# Patient Record
Sex: Male | Born: 1970 | Race: White | Hispanic: No | Marital: Married | Smoking: Never smoker
Health system: Southern US, Community
[De-identification: ages and names within clinical notes are randomized; demographics above are authoritative.]

## PROBLEM LIST (undated history)

## (undated) DIAGNOSIS — K219 Gastro-esophageal reflux disease without esophagitis: Secondary | ICD-10-CM

## (undated) DIAGNOSIS — T148XXA Other injury of unspecified body region, initial encounter: Secondary | ICD-10-CM

## (undated) DIAGNOSIS — F419 Anxiety disorder, unspecified: Secondary | ICD-10-CM

## (undated) HISTORY — PX: KNEE SURGERY: SHX244

## (undated) HISTORY — PX: JOINT REPLACEMENT: SHX530

## (undated) HISTORY — PX: TONSILLECTOMY: SUR1361

## (undated) HISTORY — PX: WISDOM TOOTH EXTRACTION: SHX21

## (undated) HISTORY — PX: ROTATOR CUFF REPAIR: SHX139

## (undated) HISTORY — PX: CHOLECYSTECTOMY: SHX55

## (undated) HISTORY — PX: CERVICAL FUSION: SHX112

---

## 2005-01-06 ENCOUNTER — Ambulatory Visit (HOSPITAL_COMMUNITY): Admission: RE | Admit: 2005-01-06 | Discharge: 2005-01-06 | Payer: Self-pay | Admitting: Neurosurgery

## 2005-02-13 ENCOUNTER — Encounter: Admission: RE | Admit: 2005-02-13 | Discharge: 2005-02-13 | Payer: Self-pay | Admitting: Neurosurgery

## 2005-08-19 IMAGING — RF DG CERVICAL SPINE 1V
1 series · 3 of 3 positions shown · non-contrast
Comparison: none

CLINICAL DATA: Herniated nucleus pulposus.  ACDF/plating at C6-7.
 CERVICAL SPINE SINGLE VIEW:
 Two films are made in the lateral projection in the operating room and show good anterior position of the metal plate and screws at the C6-7 ACDF and plating.

[Series 0: run · 3 of 3 slices shown]
[im 1/3]
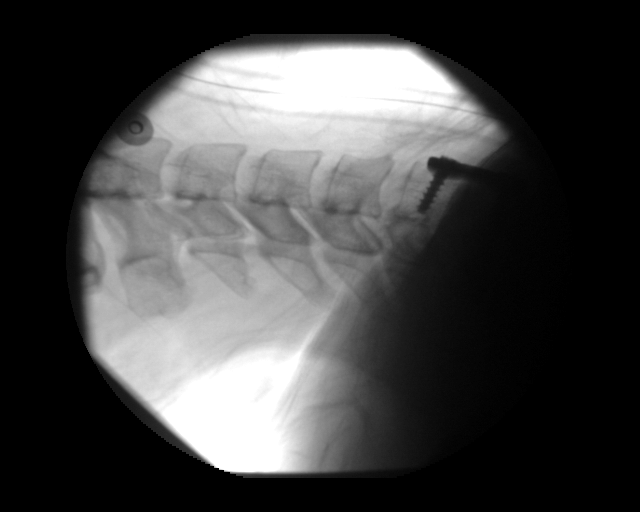
[im 2/3]
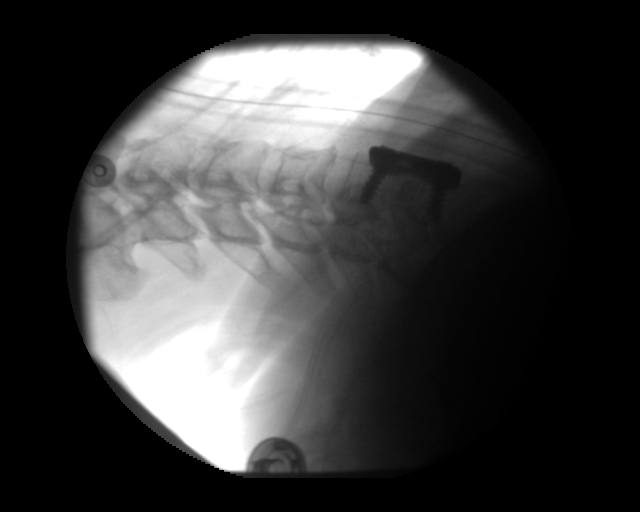
[im 3/3]
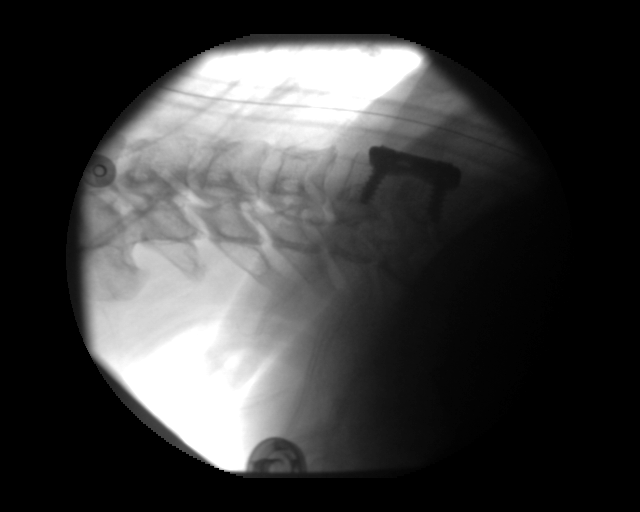

[3 of 3 positions shown; findings below may reference images not displayed]

IMPRESSION: Good position of plating/ACDF.

## 2005-09-26 IMAGING — CR DG CERVICAL SPINE 2 OR 3 VIEWS
2 series · 2 of 2 positions shown · non-contrast
Comparison: none

CLINICAL DATA: Surgery in [REDACTED] for anterior cervical spine fusion.  Some pain.
 CERVICAL SPINE ? TWO VIEWS:
 Two views of the cervical spine are compared to an intraoperative film of 01/06/05 from [HOSPITAL].  Anterior fusion at C6-7 appears stable.  The interbody fusion plug is in good position as is the anterior metallic fixation plate with slightly straightened alignment.  No significant prevertebral soft tissue swelling is seen.

[view not recorded (1 of 2)]
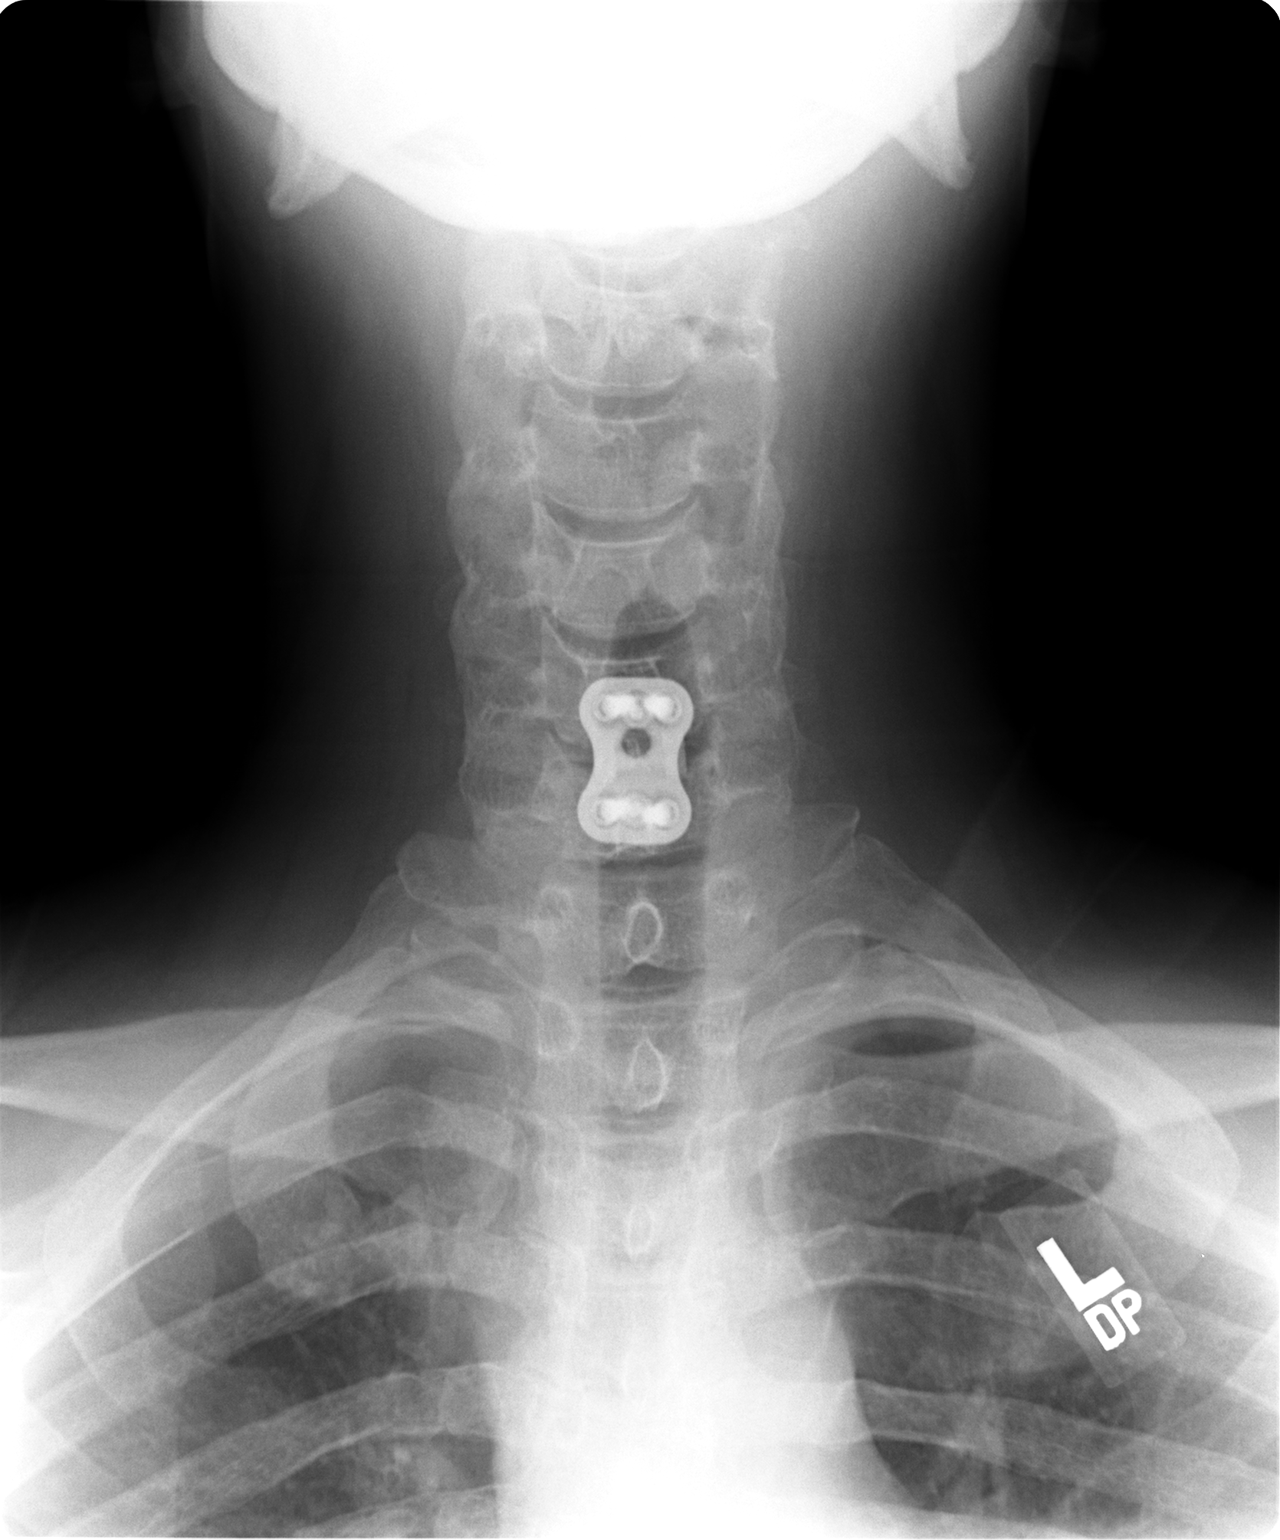

[view not recorded (2 of 2)]
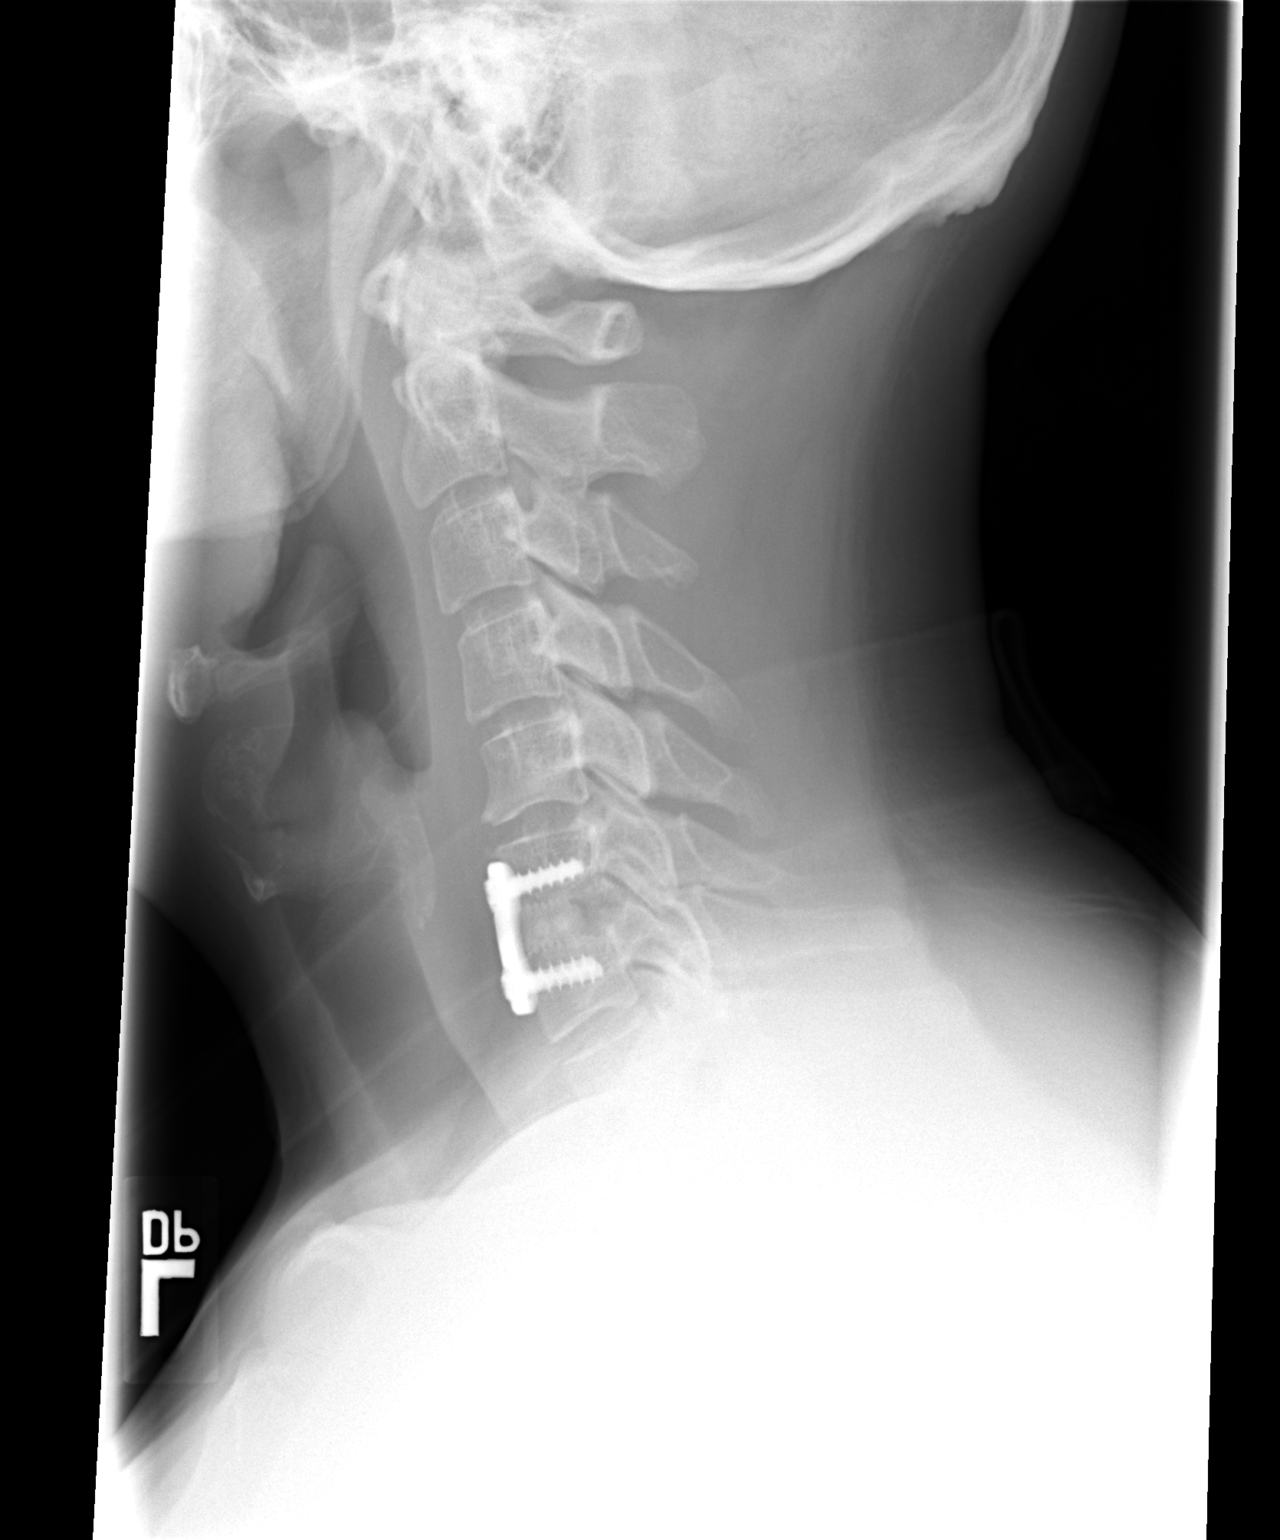

[2 of 2 positions shown; findings below may reference images not displayed]

IMPRESSION: Stable anterior fusion at C6-7 with slightly straightened alignment.

## 2019-04-08 ENCOUNTER — Encounter (HOSPITAL_COMMUNITY): Payer: Self-pay | Admitting: *Deleted

## 2019-04-08 ENCOUNTER — Other Ambulatory Visit: Payer: Self-pay

## 2019-04-08 NOTE — Progress Notes (Signed)
Pt denies SOB, chest pain, and being under the care of a cardiologist. Pt denies having a stress test, echo and cardiac cath. Pt denies having an EKG and chest x ray within the last year. Pt denies recent labs. Pt made aware to stop taking Aspirin (unless otherwise advised by surgeon), vitamins, fish oil and herbal medications. Do not take any NSAIDs ie: Ibuprofen, Advil, Naproxen (Aleve), Motrin, BC and Goody Powder.  Pt denies that he and family members tested positive for COVID-19.   Coronavirus Screening  Pt denies that he and members of his family experienced the following symptoms:  Cough yes/no: No Fever (>100.88F)  yes/no: No Runny nose yes/no: No Sore throat yes/no: No Difficulty breathing/shortness of breath  yes/no: No  Have you or a family member traveled in the last 14 days and where? yes/no: No  Pt reminded  that hospital visitation restrictions are in effect and the importance of the restrictions.  Pt verbalized understanding of all pre-op instructions.

## 2019-04-10 NOTE — H&P (Addendum)
Orthopaedic Trauma Service (OTS) Consult   Patient ID: Gordon Myers Vogl MRN: [id number] DOB/AGE: 07/29/1971 48 y.o.   CC: R foot 5th MTT fx, jones fracture    HPI: Gordon Myers Herrod is an 48 y.o.white male who injured his right foot at work approximately a week ago.  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  He was walking during his job and felt a pop in his right foot.  Patient with initially seen and evaluated at his primary care physician's office where x-rays of his right foot were obtained which did demonstrate a right metatarsal fracture.  He presented to the orthopedic office for further evaluation.  New images were obtained which showed some concern for the fracture being in the metadiaphyseal region of his fifth metatarsal consistent with a Jones fracture.  We discussed surgical versus nonsurgical management.  Given his occupation we felt that the safest course of action and most reliable treatment for him would be to proceed with surgical intervention.  Patient is was in agreement with this plan.  He presents today for ORIF of his left fifth metatarsal.   Past Medical History:  Diagnosis Date  . Anxiety   . Fracture    " right pinky toe"  . GERD (gastroesophageal reflux disease)    PMH    Past Surgical History:  Procedure Laterality Date  . CERVICAL FUSION    . CHOLECYSTECTOMY    . JOINT REPLACEMENT     right knee  . KNEE SURGERY    . ROTATOR CUFF REPAIR     B/L  . TONSILLECTOMY    . WISDOM TOOTH EXTRACTION      History reviewed. No pertinent family history.  Social History:  reports that he has never smoked. He has never used smokeless tobacco. He reports current alcohol use. He reports that he does not use drugs.  Allergies: No Known Allergies  Medications: I have reviewed the patient's current medications. Current Meds  Medication Sig  . escitalopram (LEXAPRO) 20 MG tablet Take 20 mg by mouth daily.  Marland Kitchen. esomeprazole (NEXIUM) 20 MG capsule Take 20 mg by mouth  daily at 12 noon.  Marland Kitchen. oxyCODONE-acetaminophen (PERCOCET/ROXICET) 5-325 MG tablet Take 1 tablet by mouth at bedtime.     No results found for this or any previous visit (from the past 48 hour(s)).  No results found.  Review of Systems  Constitutional: Negative for chills and fever.  Respiratory: Negative for shortness of breath and wheezing.   Cardiovascular: Negative for chest pain and palpitations.  Gastrointestinal: Negative for nausea and vomiting.  Genitourinary: Negative for urgency.  Neurological: Negative for tingling and sensory change.   There were no vitals taken for this visit. Physical Exam Constitutional:      Appearance: Normal appearance. He is well-developed and well-groomed.  HENT:     Head: Normocephalic and atraumatic.  Eyes:     Extraocular Movements: Extraocular movements intact.  Cardiovascular:     Rate and Rhythm: Normal rate and regular rhythm.     Heart sounds: S1 normal.  Pulmonary:     Effort: Pulmonary effort is normal. No prolonged expiration or respiratory distress.     Breath sounds: Normal breath sounds and air entry.  Abdominal:     Palpations: Abdomen is soft.     Tenderness: There is no abdominal tenderness.     Comments: + Bowel Sounds   Musculoskeletal:     Comments: Right Foot Mild swelling and ecchymosis present to  the lateral aspect of his right foot Significant tenderness palpation to the lateral aspect of his right foot over the fifth metatarsal Extremity is warm + DP pulse The remainder of his forefoot midfoot and hindfoot are nontender No ankle or knee tenderness Good ankle and knee range of motion DPN, SPN, TN sensory functions intact EHL, FHL, lesser toe motor function intact No deep calf tenderness Soft tissue does not show signs of infection No open wounds or lesions  Skin:    General: Skin is warm.     Capillary Refill: Capillary refill takes less than 2 seconds.  Neurological:     Mental Status: He is alert and  oriented to person, place, and time.     Coordination: Coordination is intact.     Comments: Antalgic gait due to 5th MTT fx  Psychiatric:        Attention and Perception: Attention and perception normal.        Mood and Affect: Mood and affect normal.        Speech: Speech normal.        Behavior: Behavior is cooperative.        Cognition and Memory: Cognition and memory normal.     Assessment/Plan:  48 year old white male with work-related injury, right fifth metatarsal fracture (Jones fracture)  -Right fifth metatarsal Jones fracture, closed  OR for ORIF right fifth metatarsal (intramedullary screw)  Nonweightbearing 6 weeks  Immobilized in splint for 2 weeks and then convert to cam boot  Outpatient procedure  Follow-up with orthopedics in 10 to 14 days for suture removal and x-rays  We are attempting to get the patient a exigent bone growth stimulator as an adjuvant to the healing process   - Pain management:  Pain medications were provided at his office visit on Wednesday  Would anticipate quick wean off - ABL anemia/Hemodynamics  Stable - Medical issues   No severe chronic medical issues - DVT/PE prophylaxis:  We will likely have the patient do aspirin 325 mg twice daily for the next 4 weeks - ID:   Perioperative antibiotics - Metabolic Bone Disease:  Metabolic bone labs are pending  Will correct where necessary  - Impediments to fracture healing:  Tension side injury   ?stress component   - Dispo:  OR for ORIF 5th MTT  Dc home once stable from PACU    Mearl Latin, PA-C [phone] (C) 04/10/2019, 9:44 PM  [facility] [address] [place] [address] [phone] [phone] (F)   I have seen and examined the patient. I agree with the findings above.  I discussed with the patient the risks and benefits of surgery for right Jones metatarsal fracture, including the possibility of infection, nerve injury, vessel injury,  wound breakdown, arthritis, symptomatic hardware, DVT/ PE, loss of motion, malunion, nonunion, and need for further surgery among others.  He acknowledged these risks and wished to proceed.   Budd Palmer, MD 04/11/2019 8:02 AM

## 2019-04-11 ENCOUNTER — Ambulatory Visit (HOSPITAL_COMMUNITY)
Admission: RE | Admit: 2019-04-11 | Discharge: 2019-04-11 | Disposition: A | Discharge status: Home / Self Care | Payer: BLUE CROSS/BLUE SHIELD | Attending: Orthopedic Surgery | Admitting: Orthopedic Surgery

## 2019-04-11 ENCOUNTER — Encounter (HOSPITAL_COMMUNITY)
Admission: RE | Disposition: A | Discharge status: Home / Self Care | Payer: Self-pay | Source: Home / Self Care | Attending: Orthopedic Surgery

## 2019-04-11 ENCOUNTER — Other Ambulatory Visit: Payer: Self-pay

## 2019-04-11 ENCOUNTER — Ambulatory Visit (HOSPITAL_COMMUNITY): Payer: BLUE CROSS/BLUE SHIELD | Admitting: Registered Nurse

## 2019-04-11 ENCOUNTER — Ambulatory Visit (HOSPITAL_COMMUNITY): Payer: BLUE CROSS/BLUE SHIELD

## 2019-04-11 ENCOUNTER — Encounter (HOSPITAL_COMMUNITY): Payer: Self-pay | Admitting: Anesthesiology

## 2019-04-11 DIAGNOSIS — Z9049 Acquired absence of other specified parts of digestive tract: Secondary | ICD-10-CM | POA: Insufficient documentation

## 2019-04-11 DIAGNOSIS — Z7982 Long term (current) use of aspirin: Secondary | ICD-10-CM | POA: Insufficient documentation

## 2019-04-11 DIAGNOSIS — Z419 Encounter for procedure for purposes other than remedying health state, unspecified: Secondary | ICD-10-CM

## 2019-04-11 DIAGNOSIS — K219 Gastro-esophageal reflux disease without esophagitis: Secondary | ICD-10-CM | POA: Diagnosis not present

## 2019-04-11 DIAGNOSIS — Z96651 Presence of right artificial knee joint: Secondary | ICD-10-CM | POA: Diagnosis not present

## 2019-04-11 DIAGNOSIS — Y9301 Activity, walking, marching and hiking: Secondary | ICD-10-CM | POA: Insufficient documentation

## 2019-04-11 DIAGNOSIS — Z79899 Other long term (current) drug therapy: Secondary | ICD-10-CM | POA: Insufficient documentation

## 2019-04-11 DIAGNOSIS — F419 Anxiety disorder, unspecified: Secondary | ICD-10-CM | POA: Diagnosis not present

## 2019-04-11 DIAGNOSIS — S92351A Displaced fracture of fifth metatarsal bone, right foot, initial encounter for closed fracture: Secondary | ICD-10-CM | POA: Insufficient documentation

## 2019-04-11 DIAGNOSIS — R2689 Other abnormalities of gait and mobility: Secondary | ICD-10-CM | POA: Insufficient documentation

## 2019-04-11 HISTORY — DX: Gastro-esophageal reflux disease without esophagitis: K21.9

## 2019-04-11 HISTORY — PX: ORIF TOE FRACTURE: SHX5032

## 2019-04-11 HISTORY — DX: Anxiety disorder, unspecified: F41.9

## 2019-04-11 HISTORY — DX: Other injury of unspecified body region, initial encounter: T14.8XXA

## 2019-04-11 LAB — CBC WITH DIFFERENTIAL/PLATELET
Abs Immature Granulocytes: 0.02 10*3/uL (ref 0.00–0.07)
Basophils Absolute: 0.1 10*3/uL (ref 0.0–0.1)
Basophils Relative: 1 %
Eosinophils Absolute: 0.1 10*3/uL (ref 0.0–0.5)
Eosinophils Relative: 3 %
HCT: 42.3 % (ref 39.0–52.0)
Hemoglobin: 12.9 g/dL — ABNORMAL LOW (ref 13.0–17.0)
Immature Granulocytes: 0 %
Lymphocytes Relative: 23 %
Lymphs Abs: 1.1 10*3/uL (ref 0.7–4.0)
MCH: 24.2 pg — ABNORMAL LOW (ref 26.0–34.0)
MCHC: 30.5 g/dL (ref 30.0–36.0)
MCV: 79.5 fL — ABNORMAL LOW (ref 80.0–100.0)
Monocytes Absolute: 0.6 10*3/uL (ref 0.1–1.0)
Monocytes Relative: 11 %
Neutro Abs: 3 10*3/uL (ref 1.7–7.7)
Neutrophils Relative %: 62 %
Platelets: 166 10*3/uL (ref 150–400)
RBC: 5.32 MIL/uL (ref 4.22–5.81)
RDW: 16.6 % — ABNORMAL HIGH (ref 11.5–15.5)
WBC: 4.9 10*3/uL (ref 4.0–10.5)
nRBC: 0 % (ref 0.0–0.2)

## 2019-04-11 LAB — COMPREHENSIVE METABOLIC PANEL
ALT: 60 U/L — ABNORMAL HIGH (ref 0–44)
AST: 70 U/L — ABNORMAL HIGH (ref 15–41)
Albumin: 3.7 g/dL (ref 3.5–5.0)
Alkaline Phosphatase: 101 U/L (ref 38–126)
Anion gap: 12 (ref 5–15)
BUN: 5 mg/dL — ABNORMAL LOW (ref 6–20)
CO2: 25 mmol/L (ref 22–32)
Calcium: 9.3 mg/dL (ref 8.9–10.3)
Chloride: 102 mmol/L (ref 98–111)
Creatinine, Ser: 0.85 mg/dL (ref 0.61–1.24)
GFR calc Af Amer: >60 mL/min (ref >60)
GFR calc non Af Amer: >60 mL/min (ref >60)
Glucose, Bld: 136 mg/dL — ABNORMAL HIGH (ref 70–99)
Potassium: 4.2 mmol/L (ref 3.5–5.1)
Sodium: 139 mmol/L (ref 135–145)
Total Bilirubin: 0.9 mg/dL (ref 0.3–1.2)
Total Protein: 7 g/dL (ref 6.5–8.1)

## 2019-04-11 LAB — PROTIME-INR
INR: 1.1 (ref 0.8–1.2)
Prothrombin Time: 14.5 seconds (ref 11.4–15.2)

## 2019-04-11 LAB — PHOSPHORUS: Phosphorus: 3.4 mg/dL (ref 2.5–4.6)

## 2019-04-11 LAB — PREALBUMIN: Prealbumin: 19.4 mg/dL (ref 18–38)

## 2019-04-11 LAB — MAGNESIUM: Magnesium: 2.1 mg/dL (ref 1.7–2.4)

## 2019-04-11 LAB — APTT: aPTT: 30 seconds (ref 24–36)

## 2019-04-11 LAB — TSH: TSH: 3.84 u[IU]/mL (ref 0.350–4.500)

## 2019-04-11 SURGERY — OPEN REDUCTION INTERNAL FIXATION (ORIF) METATARSAL (TOE) FRACTURE
Anesthesia: Monitor Anesthesia Care | Site: Foot | Laterality: Right

## 2019-04-11 MED ORDER — POVIDONE-IODINE 10 % EX SWAB: 2.0000 "application " | Freq: Once | CUTANEOUS | Status: DC

## 2019-04-11 MED ORDER — PROPOFOL 10 MG/ML IV BOLUS
INTRAVENOUS | Status: AC
  Filled 2019-04-11: qty 20

## 2019-04-11 MED ORDER — MIDAZOLAM HCL 2 MG/2ML IJ SOLN
INTRAMUSCULAR | Status: AC
  Filled 2019-04-11: qty 2

## 2019-04-11 MED ORDER — LACTATED RINGERS IV SOLN
INTRAVENOUS | Status: DC
  Administered 2019-04-11: 07:00:00 via INTRAVENOUS

## 2019-04-11 MED ORDER — DEXAMETHASONE SODIUM PHOSPHATE 10 MG/ML IJ SOLN
INTRAMUSCULAR | Status: AC
  Filled 2019-04-11: qty 1

## 2019-04-11 MED ORDER — BUPIVACAINE HCL (PF) 0.25 % IJ SOLN
INTRAMUSCULAR | Status: AC
  Filled 2019-04-11: qty 30

## 2019-04-11 MED ORDER — BUPIVACAINE HCL (PF) 0.25 % IJ SOLN
INTRAMUSCULAR | Status: DC | PRN
  Administered 2019-04-11: 10 mL

## 2019-04-11 MED ORDER — GABAPENTIN 300 MG PO CAPS
300.0000 mg | ORAL_CAPSULE | Freq: Once | ORAL | Status: AC
  Administered 2019-04-11: 300 mg via ORAL
  Filled 2019-04-11: qty 1

## 2019-04-11 MED ORDER — CHLORHEXIDINE GLUCONATE 4 % EX LIQD: 60.0000 mL | Freq: Once | CUTANEOUS | Status: DC

## 2019-04-11 MED ORDER — ASPIRIN EC 325 MG PO TBEC: 325.0000 mg | DELAYED_RELEASE_TABLET | Freq: Two times a day (BID) | ORAL | 0 refills | Status: AC

## 2019-04-11 MED ORDER — MIDAZOLAM HCL 5 MG/5ML IJ SOLN
INTRAMUSCULAR | Status: DC | PRN
  Administered 2019-04-11: 2 mg via INTRAVENOUS

## 2019-04-11 MED ORDER — ACETAMINOPHEN 500 MG PO TABS
1000.0000 mg | ORAL_TABLET | Freq: Once | ORAL | Status: AC
  Administered 2019-04-11: 1000 mg via ORAL
  Filled 2019-04-11: qty 2

## 2019-04-11 MED ORDER — LIDOCAINE 2% (20 MG/ML) 5 ML SYRINGE
INTRAMUSCULAR | Status: AC
  Filled 2019-04-11: qty 5

## 2019-04-11 MED ORDER — CEFAZOLIN SODIUM-DEXTROSE 2-4 GM/100ML-% IV SOLN
2.0000 g | INTRAVENOUS | Status: AC
  Administered 2019-04-11: 2 g via INTRAVENOUS
  Filled 2019-04-11: qty 100

## 2019-04-11 MED ORDER — PROPOFOL 1000 MG/100ML IV EMUL
INTRAVENOUS | Status: AC
  Filled 2019-04-11: qty 100

## 2019-04-11 MED ORDER — DEXAMETHASONE SODIUM PHOSPHATE 10 MG/ML IJ SOLN
INTRAMUSCULAR | Status: DC | PRN
  Administered 2019-04-11: 5 mg via INTRAVENOUS

## 2019-04-11 MED ORDER — ONDANSETRON HCL 4 MG/2ML IJ SOLN
INTRAMUSCULAR | Status: AC
  Filled 2019-04-11: qty 2

## 2019-04-11 MED ORDER — PROPOFOL 500 MG/50ML IV EMUL
INTRAVENOUS | Status: DC | PRN
  Administered 2019-04-11: 75 ug/kg/min via INTRAVENOUS

## 2019-04-11 MED ORDER — PROPOFOL 10 MG/ML IV BOLUS
INTRAVENOUS | Status: DC | PRN
  Administered 2019-04-11 (×3): 30 mg via INTRAVENOUS
  Administered 2019-04-11: 50 mg via INTRAVENOUS

## 2019-04-11 MED ORDER — FENTANYL CITRATE (PF) 250 MCG/5ML IJ SOLN
INTRAMUSCULAR | Status: AC
  Filled 2019-04-11: qty 5

## 2019-04-11 MED ORDER — ONDANSETRON HCL 4 MG/2ML IJ SOLN
INTRAMUSCULAR | Status: DC | PRN
  Administered 2019-04-11: 4 mg via INTRAVENOUS

## 2019-04-11 MED ORDER — FENTANYL CITRATE (PF) 100 MCG/2ML IJ SOLN
INTRAMUSCULAR | Status: DC | PRN
  Administered 2019-04-11: 50 ug via INTRAVENOUS

## 2019-04-11 MED ORDER — 0.9 % SODIUM CHLORIDE (POUR BTL) OPTIME
TOPICAL | Status: DC | PRN
  Administered 2019-04-11: 1000 mL

## 2019-04-11 SURGICAL SUPPLY — 62 items
BANDAGE ACE 4X5 VEL STRL LF (GAUZE/BANDAGES/DRESSINGS) IMPLANT
BANDAGE ACE 6X5 VEL STRL LF (GAUZE/BANDAGES/DRESSINGS) IMPLANT
BANDAGE ESMARK 6X9 LF (GAUZE/BANDAGES/DRESSINGS) ×1 IMPLANT
BIT DRILL 4.5 CANN DISP (BIT) ×2 IMPLANT
BLADE SURG 10 STRL SS (BLADE) ×3 IMPLANT
BNDG CMPR 9X6 STRL LF SNTH (GAUZE/BANDAGES/DRESSINGS) ×1
BNDG COHESIVE 4X5 TAN STRL (GAUZE/BANDAGES/DRESSINGS) ×3 IMPLANT
BNDG ESMARK 6X9 LF (GAUZE/BANDAGES/DRESSINGS) ×3
BNDG GAUZE ELAST 4 BULKY (GAUZE/BANDAGES/DRESSINGS) ×6 IMPLANT
BRUSH SCRUB SURG 4.25 DISP (MISCELLANEOUS) ×6 IMPLANT
COVER SURGICAL LIGHT HANDLE (MISCELLANEOUS) ×6 IMPLANT
COVER WAND RF STERILE (DRAPES) ×3 IMPLANT
DRAPE C-ARM 42X72 X-RAY (DRAPES) IMPLANT
DRAPE C-ARMOR (DRAPES) ×3 IMPLANT
DRAPE EXTREMITY TIBURON (DRAPES) ×2 IMPLANT
DRAPE HALF SHEET 40X57 (DRAPES) ×2 IMPLANT
DRAPE IMP U-DRAPE 54X76 (DRAPES) ×4 IMPLANT
DRAPE U-SHAPE 47X51 STRL (DRAPES) ×3 IMPLANT
DRSG EMULSION OIL 3X3 NADH (GAUZE/BANDAGES/DRESSINGS) ×2 IMPLANT
ELECT REM PT RETURN 9FT ADLT (ELECTROSURGICAL) ×3
ELECTRODE REM PT RTRN 9FT ADLT (ELECTROSURGICAL) ×1 IMPLANT
GAUZE SPONGE 4X4 12PLY STRL (GAUZE/BANDAGES/DRESSINGS) ×2 IMPLANT
GLOVE BIO SURGEON STRL SZ7.5 (GLOVE) ×3 IMPLANT
GLOVE BIO SURGEON STRL SZ8 (GLOVE) ×3 IMPLANT
GLOVE BIOGEL PI IND STRL 7.5 (GLOVE) ×1 IMPLANT
GLOVE BIOGEL PI IND STRL 8 (GLOVE) ×1 IMPLANT
GLOVE BIOGEL PI INDICATOR 7.5 (GLOVE) ×2
GLOVE BIOGEL PI INDICATOR 8 (GLOVE) ×2
GOWN STRL REUS W/ TWL LRG LVL3 (GOWN DISPOSABLE) ×2 IMPLANT
GOWN STRL REUS W/ TWL XL LVL3 (GOWN DISPOSABLE) ×1 IMPLANT
GOWN STRL REUS W/TWL LRG LVL3 (GOWN DISPOSABLE) ×6
GOWN STRL REUS W/TWL XL LVL3 (GOWN DISPOSABLE) ×3
KIT BASIN OR (CUSTOM PROCEDURE TRAY) ×3 IMPLANT
KIT TURNOVER KIT B (KITS) ×3 IMPLANT
MANIFOLD NEPTUNE II (INSTRUMENTS) ×3 IMPLANT
NDL HYPO 21X1.5 SAFETY (NEEDLE) IMPLANT
NEEDLE HYPO 21X1.5 SAFETY (NEEDLE) IMPLANT
NS IRRIG 1000ML POUR BTL (IV SOLUTION) ×3 IMPLANT
PACK GENERAL/GYN (CUSTOM PROCEDURE TRAY) ×3 IMPLANT
PAD ARMBOARD 7.5X6 YLW CONV (MISCELLANEOUS) ×6 IMPLANT
PAD CAST 4YDX4 CTTN HI CHSV (CAST SUPPLIES) IMPLANT
PADDING CAST COTTON 4X4 STRL (CAST SUPPLIES)
PADDING CAST COTTON 6X4 STRL (CAST SUPPLIES) ×2 IMPLANT
PENCIL BUTTON HOLSTER BLD 10FT (ELECTRODE) ×3 IMPLANT
SCREW LAG CANN CANC 5.0 20X55 (Screw) ×2 IMPLANT
SPONGE LAP 18X18 RF (DISPOSABLE) ×9 IMPLANT
STAPLER VISISTAT 35W (STAPLE) IMPLANT
SUCTION FRAZIER HANDLE 10FR (MISCELLANEOUS) ×2
SUCTION TUBE FRAZIER 10FR DISP (MISCELLANEOUS) ×1 IMPLANT
SUT ETHILON 2 0 FS 18 (SUTURE) ×9 IMPLANT
SUT ETHILON 3 0 PS 1 (SUTURE) ×6 IMPLANT
SUT PDS AB 2-0 CT1 27 (SUTURE) IMPLANT
SUT VIC AB 2-0 CT1 27 (SUTURE) ×6
SUT VIC AB 2-0 CT1 TAPERPNT 27 (SUTURE) ×2 IMPLANT
SUT VIC AB 2-0 CT3 27 (SUTURE) IMPLANT
SYR CONTROL 10ML LL (SYRINGE) IMPLANT
TOWEL OR 17X24 6PK STRL BLUE (TOWEL DISPOSABLE) ×3 IMPLANT
TOWEL OR 17X26 10 PK STRL BLUE (TOWEL DISPOSABLE) ×6 IMPLANT
TUBE CONNECTING 12'X1/4 (SUCTIONS) ×1
TUBE CONNECTING 12X1/4 (SUCTIONS) ×2 IMPLANT
UNDERPAD 30X30 (UNDERPADS AND DIAPERS) ×3 IMPLANT
WATER STERILE IRR 1000ML POUR (IV SOLUTION) ×3 IMPLANT

## 2019-04-11 NOTE — Brief Op Note (Signed)
04/11/2019  10:03 AM  PATIENT:  Vic Blackbird  48 y.o. male  PRE-OPERATIVE DIAGNOSIS:  RIGHT FIFTH METATARSAL JONES FRACTURE  POST-OPERATIVE DIAGNOSIS:  RIGHT FIFTH METATARSAL JONES FRACTURE  PROCEDURE:  Procedure(s): OPEN REDUCTION INTERNAL FIXATION (ORIF) RIGHT FIFTH METATARSAL (TOE) FRACTURE (Right)  SURGEON:  Surgeon(s) and Role:    Myrene Galas, MD - Primary  ASSISTANTS: RNFA   ANESTHESIA:   local and IV sedation  EBL:  15 mL   BLOOD ADMINISTERED:none  DRAINS: none   LOCAL MEDICATIONS USED:  MARCAINE     SPECIMEN:  No Specimen  DISPOSITION OF SPECIMEN:  N/A  COUNTS:  YES  TOURNIQUET:  * No tourniquets in log *  DICTATION: .Other Dictation: Dictation Number [id number]  PLAN OF CARE: Discharge to home after PACU  PATIENT DISPOSITION:  PACU - hemodynamically stable.   Delay start of Pharmacological VTE agent (>24hrs) due to surgical blood loss or risk of bleeding: no

## 2019-04-11 NOTE — Discharge Instructions (Addendum)
Orthopaedic Trauma Service Discharge Instructions   General Discharge Instructions  WEIGHT BEARING STATUS: Nonweightbearing Right leg   RANGE OF MOTION/ACTIVITY: activity as tolerated, Nonweightbearing right leg   Bone health:  Bone health labs are pending. We can review them at your post operative follow up   Wound Care: do not remove splint, do not get splint wet. You may purchase a cast cover to help keep water off of splint when showering (found on amazon or at local drug store). Alternatively, you may use a garbage bag and make it water tight as possible    DVT/PE prophylaxis: aspirin 325 mg every 12 hours x 4 weeks   Diet: as you were eating previously.  Can use over the counter stool softeners and bowel preparations, such as Miralax, to help with bowel movements.  Narcotics can be constipating.  Be sure to drink plenty of fluids  PAIN MEDICATION USE AND EXPECTATIONS  You have likely been given narcotic medications to help control your pain.  After a traumatic event that results in an fracture (broken bone) with or without surgery, it is ok to use narcotic pain medications to help control one's pain.  We understand that everyone responds to pain differently and each individual patient will be evaluated on a regular basis for the continued need for narcotic medications. Ideally, narcotic medication use should last no more than 6-8 weeks (coinciding with fracture healing).   As a patient it is your responsibility as well to monitor narcotic medication use and report the amount and frequency you use these medications when you come to your office visit.   We would also advise that if you are using narcotic medications, you should take a dose prior to therapy to maximize you participation.  IF YOU ARE ON NARCOTIC MEDICATIONS IT IS NOT PERMISSIBLE TO OPERATE A MOTOR VEHICLE (MOTORCYCLE/CAR/TRUCK/MOPED) OR HEAVY MACHINERY DO NOT MIX NARCOTICS WITH OTHER CNS (CENTRAL NERVOUS SYSTEM)  DEPRESSANTS SUCH AS ALCOHOL   STOP SMOKING OR USING NICOTINE PRODUCTS!!!!  As discussed nicotine severely impairs your body's ability to heal surgical and traumatic wounds but also impairs bone healing.  Wounds and bone heal by forming microscopic blood vessels (angiogenesis) and nicotine is a vasoconstrictor (essentially, shrinks blood vessels).  Therefore, if vasoconstriction occurs to these microscopic blood vessels they essentially disappear and are unable to deliver necessary nutrients to the healing tissue.  This is one modifiable factor that you can do to dramatically increase your chances of healing your injury.    (This means no smoking, no nicotine gum, patches, etc)  DO NOT USE NONSTEROIDAL ANTI-INFLAMMATORY DRUGS (NSAID'S)  Using products such as Advil (ibuprofen), Aleve (naproxen), Motrin (ibuprofen) for additional pain control during fracture healing can delay and/or prevent the healing response.  If you would like to take over the counter (OTC) medication, Tylenol (acetaminophen) is ok.  However, some narcotic medications that are given for pain control contain acetaminophen as well. Therefore, you should not exceed more than 4000 mg of tylenol in a day if you do not have liver disease.  Also note that there are may OTC medicines, such as cold medicines and allergy medicines that my contain tylenol as well.  If you have any questions about medications and/or interactions please ask your doctor/PA or your pharmacist.      ICE AND ELEVATE INJURED/OPERATIVE EXTREMITY  Using ice and elevating the injured extremity above your heart can help with swelling and pain control.  Icing in a pulsatile fashion, such as 20  minutes on and 20 minutes off, can be followed.    Do not place ice directly on skin. Make sure there is a barrier between to skin and the ice pack.    Using frozen items such as frozen peas works well as the conform nicely to the are that needs to be iced.  USE AN ACE WRAP OR TED  HOSE FOR SWELLING CONTROL  In addition to icing and elevation, Ace wraps or TED hose are used to help limit and resolve swelling.  It is recommended to use Ace wraps or TED hose until you are informed to stop.    When using Ace Wraps start the wrapping distally (farthest away from the body) and wrap proximally (closer to the body)   Example: If you had surgery on your leg or thing and you do not have a splint on, start the ace wrap at the toes and work your way up to the thigh        If you had surgery on your upper extremity and do not have a splint on, start the ace wrap at your fingers and work your way up to the upper arm  IF YOU ARE IN A SPLINT OR CAST DO NOT REMOVE IT FOR ANY REASON   If your splint gets wet for any reason please contact the office immediately. You may shower in your splint or cast as long as you keep it dry.  This can be done by wrapping in a cast cover or garbage back (or similar)  Do Not stick any thing down your splint or cast such as pencils, money, or hangers to try and scratch yourself with.  If you feel itchy take benadryl as prescribed on the bottle for itching  IF YOU ARE IN A CAM BOOT (BLACK BOOT)  You may remove boot periodically. Perform daily dressing changes as noted below.  Wash the liner of the boot regularly and wear a sock when wearing the boot. It is recommended that you sleep in the boot until told otherwise  CALL THE OFFICE WITH ANY QUESTIONS OR CONCERNS: [phone]     Cast or Splint Care, Adult Casts and splints are supports that are worn to protect broken bones and other injuries. A cast or splint may hold a bone still and in the correct position while it heals. Casts and splints may also help ease pain, swelling, and muscle spasms. A cast is a hardened support that is usually made of fiberglass or plaster. It is custom-fit to the body and it offers more protection than a splint. It cannot be taken off and put back on. A splint is a type of soft  support that is usually made from cloth and elastic. It can be adjusted or taken off as needed. You may need a cast or a splint if you:  Have a broken bone.  Have a soft-tissue injury.  Need to keep an injured body part from moving (keep it immobile) after surgery. How is this treated? If you have a cast:   Do not stick anything inside the cast to scratch your skin. Sticking something in the cast increases your risk of infection.  Check the skin around the cast every day. Tell your health care provider about any concerns.  You may put lotion on dry skin around the edges of the cast. Do not put lotion on the skin underneath the cast.  Keep the cast clean.  If the cast is not waterproof: ? Do not  let it get wet. ? Cover it with a watertight covering when you take a bath or a shower. If you have a splint:   Wear it as told by your health care provider. Remove it only as told by your health care provider.  Loosen the splint if your fingers or toes tingle, become numb, or turn cold and blue.  Keep the splint clean.  If the splint is not waterproof: ? Do not let it get wet. ? Cover it with a watertight covering when you take a bath or a shower. Bathing  Do not take baths or swim until your health care provider approves. Ask your health care provider if you can take showers. You may only be allowed to take sponge baths for bathing.  If your cast or splint is not waterproof, cover it with a watertight covering when you take a bath or shower. Managing pain, stiffness, and swelling  Move your fingers or toes often to avoid stiffness and to lessen swelling.  Raise (elevate) the injured area above the level of your heart while sitting or lying down. Safety  Do not use the injured limb to support your body weight until your health care provider says that it is okay.  Use crutches or other assistive devices as told by your health care provider. General instructions  Do not put  pressure on any part of the cast or splint until it is fully hardened. This may take several hours.  Return to your normal activities as told by your health care provider. Ask your health care provider what activities are safe for you.  Take over-the-counter and prescription medicines only as told by your health care provider.  Keep all follow-up visits as told by your health care provider. This is important. Contact a health care provider if:  Your cast or splint gets damaged.  The skin around the cast gets red or raw.  The skin under the cast is extremely itchy or painful.  Your cast or splint feels very uncomfortable.  Your cast or splint is too tight or too loose.  Your cast becomes wet or it develops a soft spot or area.  You get an object stuck under your cast. Get help right away if:  Your pain is getting worse.  The injured area tingles, becomes numb, or turns cold and blue.  The part of your body above or below the cast is swollen and discolored.  You cannot feel or move your fingers or toes.  There is fluid leaking through the cast.  You have severe pain or pressure under the cast.  You have trouble breathing.  You have shortness of breath.  You have chest pain. This information is not intended to replace advice given to you by your health care provider. Make sure you discuss any questions you have with your health care provider. Document Released: 11/21/2000 Document Revised: 06/14/2016 Document Reviewed: 05/17/2016 Elsevier Interactive Patient Education  2019 [employer]

## 2019-04-11 NOTE — Transfer of Care (Signed)
Immediate Anesthesia Transfer of Care Note  Patient: Gordon Myers  Procedure(s) Performed: OPEN REDUCTION INTERNAL FIXATION (ORIF) RIGHT METATARSAL (TOE) FRACTURE (Right Foot)  Patient Location: PACU  Anesthesia Type:MAC  Level of Consciousness: awake, alert  and oriented  Airway & Oxygen Therapy: Patient Spontanous Breathing and Patient connected to nasal cannula oxygen  Post-op Assessment: Report given to RN and Post -op Vital signs reviewed and stable  Post vital signs: Reviewed and stable  Last Vitals:  Vitals Value Taken Time  BP    Temp    Pulse 79 04/11/2019  9:39 AM  Resp    SpO2 95 % 04/11/2019  9:39 AM  Vitals shown include unvalidated device data.  Last Pain:  Vitals:   04/11/19 0555  TempSrc: Oral         Complications: No apparent anesthesia complications

## 2019-04-11 NOTE — Anesthesia Procedure Notes (Signed)
Procedure Name: MAC Date/Time: 04/11/2019 8:37 AM Performed by: Trinna Post., CRNA Pre-anesthesia Checklist: Patient identified, Emergency Drugs available, Suction available, Patient being monitored and Timeout performed Patient Re-evaluated:Patient Re-evaluated prior to induction Oxygen Delivery Method: Nasal cannula Preoxygenation: Pre-oxygenation with 100% oxygen Induction Type: IV induction Placement Confirmation: positive ETCO2 Dental Injury: Teeth and Oropharynx as per pre-operative assessment

## 2019-04-11 NOTE — Anesthesia Preprocedure Evaluation (Addendum)
Anesthesia Evaluation  Patient identified by MRN, date of birth, ID band Patient awake    Reviewed: Allergy & Precautions, NPO status , Patient's Chart, lab work & pertinent test results  Airway Mallampati: I  TM Distance: >3 FB Neck ROM: Full    Dental  (+) Teeth Intact, Dental Advisory Given   Pulmonary    breath sounds clear to auscultation       Cardiovascular negative cardio ROS   Rhythm:Regular Rate:Normal     Neuro/Psych Anxiety    GI/Hepatic Neg liver ROS, GERD  Medicated,  Endo/Other  negative endocrine ROS  Renal/GU negative Renal ROS     Musculoskeletal negative musculoskeletal ROS (+)   Abdominal Normal abdominal exam  (+)   Peds  Hematology negative hematology ROS (+)   Anesthesia Other Findings   Reproductive/Obstetrics                            Anesthesia Physical Anesthesia Plan  ASA: II  Anesthesia Plan: MAC   Post-op Pain Management:    Induction: Intravenous  PONV Risk Score and Plan: 2 and Ondansetron, Dexamethasone and Midazolam  Airway Management Planned: Natural Airway and Simple Face Mask  Additional Equipment: None  Intra-op Plan:   Post-operative Plan:   Informed Consent: I have reviewed the patients History and Physical, chart, labs and discussed the procedure including the risks, benefits and alternatives for the proposed anesthesia with the patient or authorized representative who has indicated his/her understanding and acceptance.       Plan Discussed with: CRNA  Anesthesia Plan Comments: (Block by surgeon. )       Anesthesia Quick Evaluation

## 2019-04-11 NOTE — Op Note (Signed)
NAME: Gordon Myers, Gordon Myers MEDICAL RECORD [id number] ACCOUNT [id number] DATE OF BIRTH:[birth year] FACILITY: [facility] LOCATION: [facility] PHYSICIAN:Anarosa Kubisiak H. Allysha Tryon, MD  OPERATIVE REPORT  DATE OF PROCEDURE:  04/11/2019  PREOPERATIVE DIAGNOSES:  Right 5th metatarsal Jones fracture.  POSTOPERATIVE DIAGNOSES:  Right 5th metatarsal Jones fracture.  PROCEDURE:  Open reduction internal fixation of right 5th metatarsal Jones fracture with Biomet 5.0 mm cannulated screw.  SURGEON:  Myrene Galas, MD  ASSISTANT:  RNFA.  ANESTHESIA:  Local and IV sedation.  ESTIMATED BLOOD LOSS:  Scant.  SPECIMENS:  None.  COMPLICATIONS:  None.  TOURNIQUET:  None.  LOCAL MEDICATION:  Marcaine.  DISPOSITION:  To PACU.  CONDITION:  Stable.  BRIEF SUMMARY AND INDICATION FOR PROCEDURE:  The patient is a 48 year old male who works for [employer] who noted an acute right foot pain and an audible crack or pop when he got out of his vehicle.  He had subsequent pain with ambulation, and x-rays  demonstrated a fracture of the right 5th metadiaphysis consistent with a Jones fracture.  The patient had borderline diabetes, but no other significant medical comorbidities.  We discussed the risks and benefits of nonoperative and operative treatment,  and because of his vocation and potential for nonunion, recommended internal fixation, particularly this was likely a stress-type component as well or fatigue fracture.  I discussed with him the risks and benefits of the procedure as well as with his  wife, which included infection, nerve injury, vessel injury, symptomatic hardware, failure to unite, and the need for further surgery, DVT, PE and others.  After a full discussion, he wished to proceed.  BRIEF SUMMARY OF PROCEDURE:  The patient was given preoperative antibiotics as well as sedation and taken to the operating room where his right lower extremity was prepped and draped in the usual sterile fashion.  No tourniquet was  used.  A tonsil clamp  was used to identify the correct starting point, and then 10 mL of Marcaine without epinephrine were injected into the skin, subcutaneous tissue, and periosteum.  A 1 cm incision was made and the guide pin advanced to the proximal aspect in line with the  canal of the 5th metatarsal.  While checking on 3 different views, it was advanced into the shaft.  I then overdrilled with the cannulated drill, followed by a measurement and placement of a 55 mm partially threaded screw.  I could see the fracture gap  quite clearly prior to placement of the screw and then obtained outstanding compression with its tightening.  Final images showed excellent position and reduction and no complications.  The wound was irrigated thoroughly and then closed with vertical  mattress sutures using 2-0 nylon.  A sterile gently compressive dressing was applied and a posterior and stirrup splint.  The patient was taken to the PACU in stable condition.  PROGNOSIS:  The patient will be nonweightbearing for 6 weeks.  We will plan to see him back in the office in 2 or 2-1/2 for removal of his sutures and conversion into a Cam boot, which will allow for hygiene.  We will begin protected weightbearing at 6  weeks and are hopeful for return to work at 8 weeks, depending on his clinical and radiographic progression.  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  He was encouraged to take aspirin daily and mobilize  frequently in order to reduce the risk of DVT and PE.  LN/NUANCE  D:04/11/2019 T:04/11/2019 JOB:[id number]

## 2019-04-12 ENCOUNTER — Encounter (HOSPITAL_COMMUNITY): Payer: Self-pay | Admitting: Orthopedic Surgery

## 2019-04-12 LAB — CALCITRIOL (1,25 DI-OH VIT D): Vit D, 1,25-Dihydroxy: 78.1 pg/mL (ref 19.9–79.3)

## 2019-04-12 LAB — PTH, INTACT AND CALCIUM
Calcium, Total (PTH): 9.2 mg/dL (ref 8.7–10.2)
PTH: 31 pg/mL (ref 15–65)

## 2019-04-12 LAB — CALCIUM, IONIZED: Calcium, Ionized, Serum: 4.9 mg/dL (ref 4.5–5.6)

## 2019-04-12 LAB — VITAMIN D 25 HYDROXY (VIT D DEFICIENCY, FRACTURES): Vit D, 25-Hydroxy: 26.5 ng/mL — ABNORMAL LOW (ref 30.0–100.0)

## 2019-04-12 NOTE — Anesthesia Postprocedure Evaluation (Signed)
Anesthesia Post Note  Patient: WHITAKER HOLDERMAN  Procedure(s) Performed: OPEN REDUCTION INTERNAL FIXATION (ORIF) RIGHT METATARSAL (TOE) FRACTURE (Right Foot)     Patient location during evaluation: PACU Anesthesia Type: MAC Level of consciousness: awake and alert Pain management: pain level controlled Vital Signs Assessment: post-procedure vital signs reviewed and stable Respiratory status: spontaneous breathing, nonlabored ventilation, respiratory function stable and patient connected to nasal cannula oxygen Cardiovascular status: stable and blood pressure returned to baseline Postop Assessment: no apparent nausea or vomiting Anesthetic complications: no    Last Vitals:  Vitals:   04/11/19 0955 04/11/19 1042  BP: 129/79 125/73  Pulse: 78 75  Resp: 17 18  Temp: (!) 36.2 C   SpO2: 94% 96%    Last Pain:  Vitals:   04/11/19 0940  TempSrc:   PainSc: 0-No pain                 Effie Berkshire

## 2019-11-22 IMAGING — RF RIGHT FOOT COMPLETE - 3+ VIEW
1 series · 6 of 6 positions shown · non-contrast
Comparison: None.

CLINICAL DATA: ORIF right fifth metatarsal

EXAM:
DG C-ARM 61-120 MIN; RIGHT FOOT COMPLETE - 3+ VIEW
FLUOROSCOPY TIME:  30 seconds

[Series 1: run · 6 of 6 slices shown]
[im 1/6]
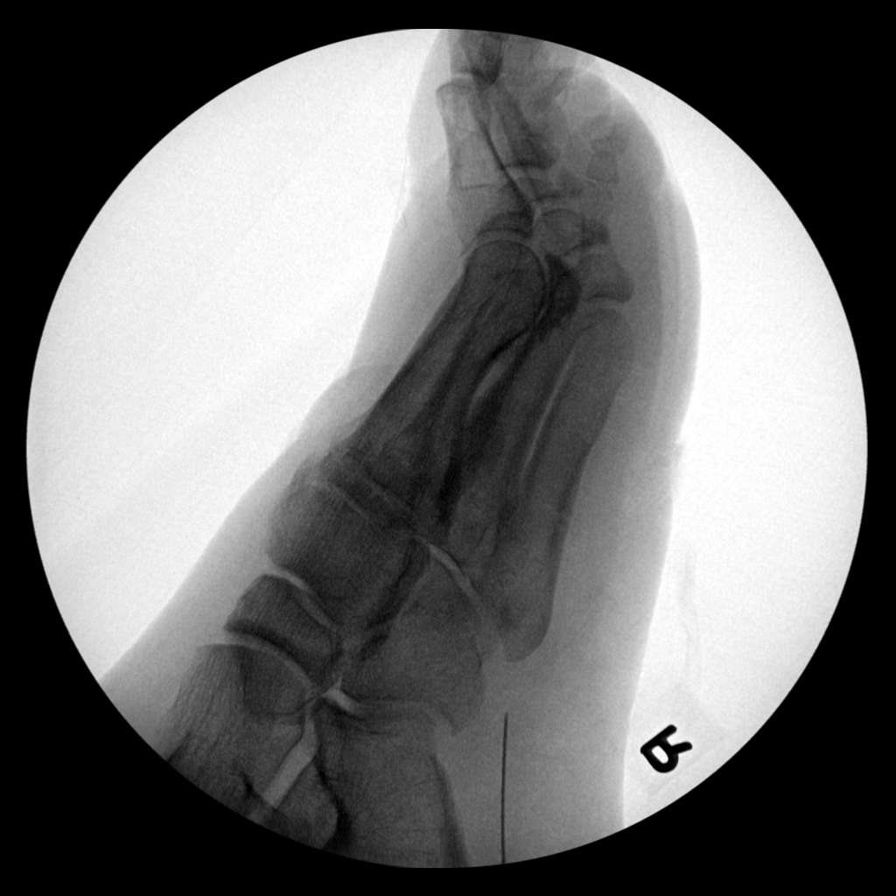
[im 2/6]
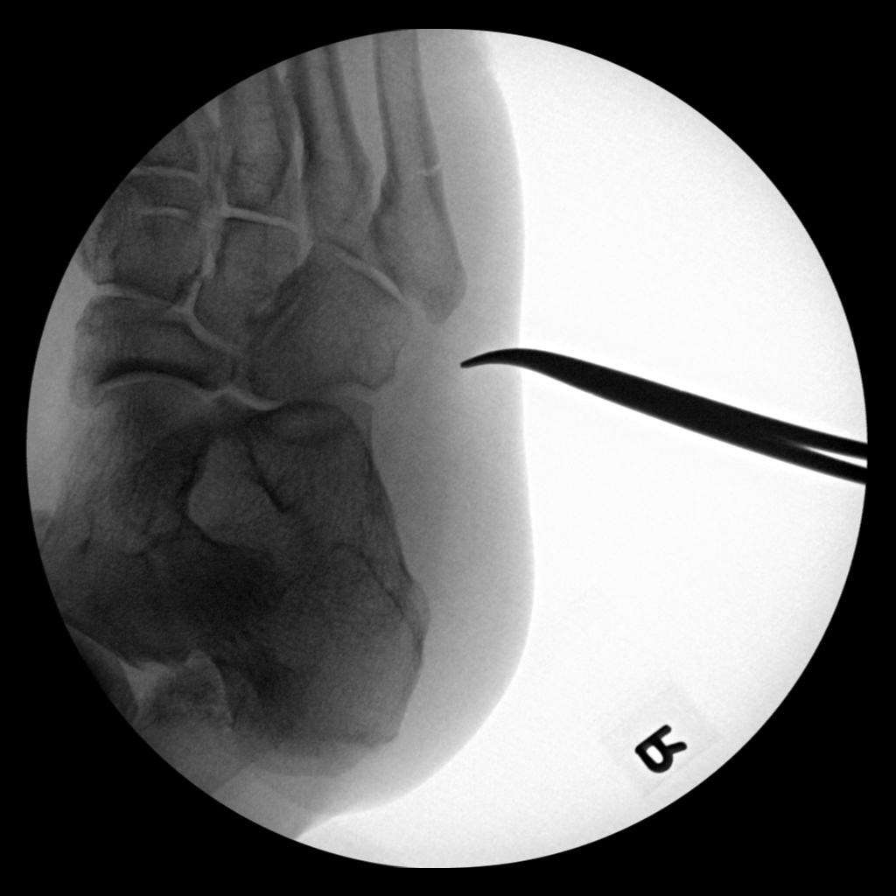
[im 3/6]
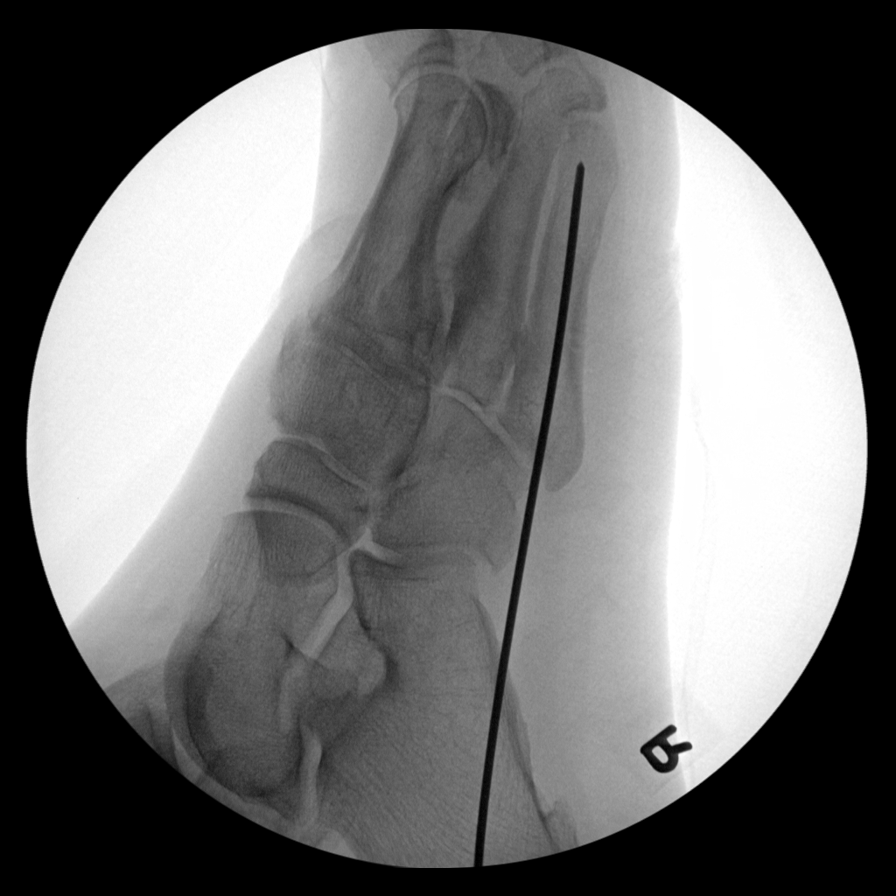
[im 4/6]
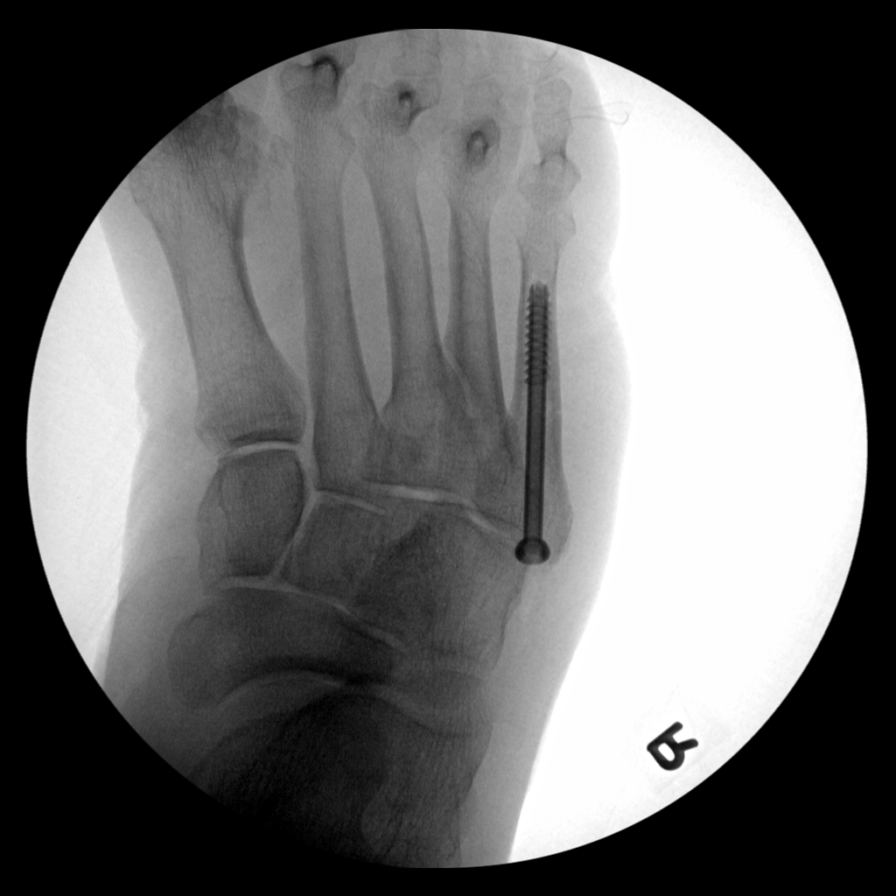
[im 5/6]
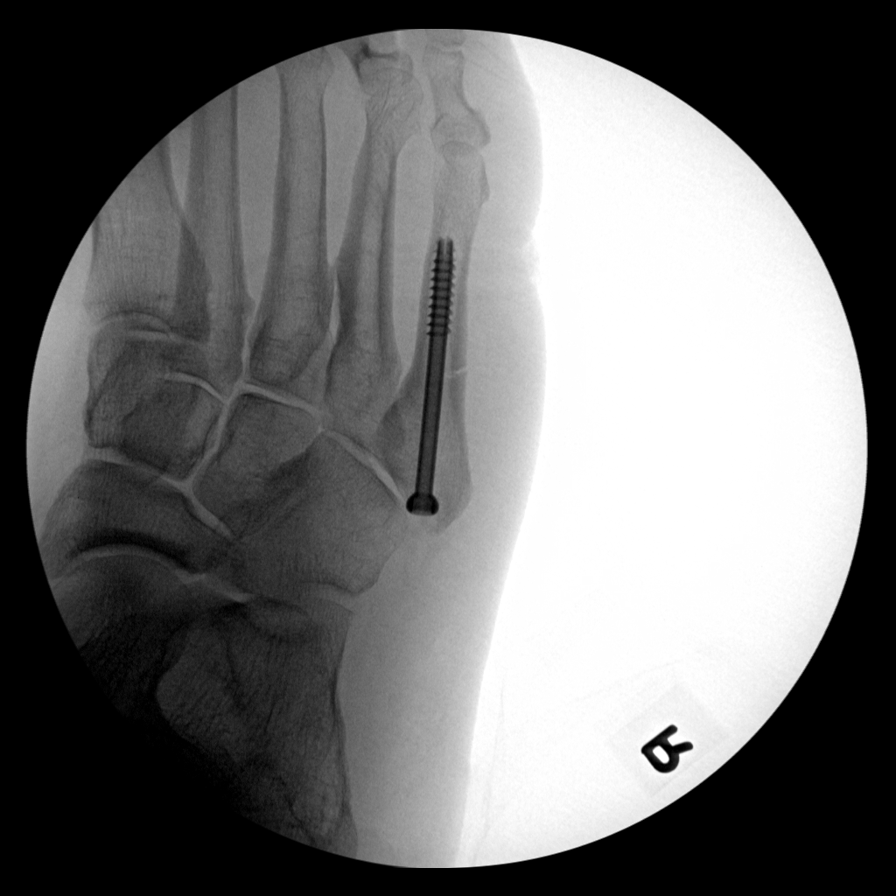
[im 6/6]
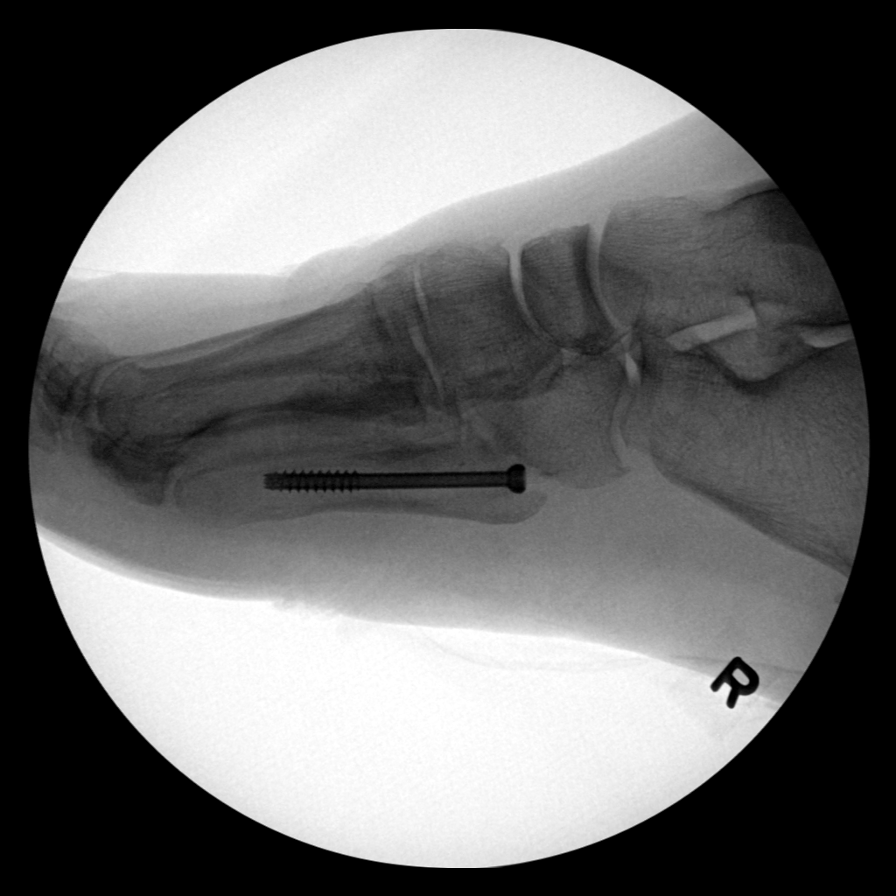

[6 of 6 positions shown; findings below may reference images not displayed]

FINDINGS: Six spot intraoperative fluoroscopic images of the right foot are
provided for review

Initial image demonstrates a radiopaque marking instrument posterior
to the base of the fifth metatarsal.

Note is made of a minimally displaced fracture involving the
proximal aspect of the base of the fifth metatarsal with subsequent
cancellous screw fixation. Alignment appears anatomic.

There is a minimal amount of expected adjacent soft tissue swelling.
No radiopaque foreign body.
IMPRESSION: Post uncomplicated cancellous screw fixation of the base of the
fifth metatarsal.
